# Patient Record
Sex: Male | Born: 1955 | Race: White | Hispanic: No | Marital: Married | Smoking: Never smoker
Health system: Southern US, Community
[De-identification: ages and names within clinical notes are randomized; demographics above are authoritative.]

---

## 2004-07-30 ENCOUNTER — Emergency Department (HOSPITAL_COMMUNITY): Admission: EM | Admit: 2004-07-30 | Discharge: 2004-07-30 | Payer: Self-pay | Admitting: Emergency Medicine

## 2005-09-29 IMAGING — CR DG CHEST 1V PORT
1 series · 1 of 1 positions shown · non-contrast
Comparison: none

CLINICAL DATA: Syncope. 
 PORTABLE CHEST ([DATE] HOURS)

[view not recorded]
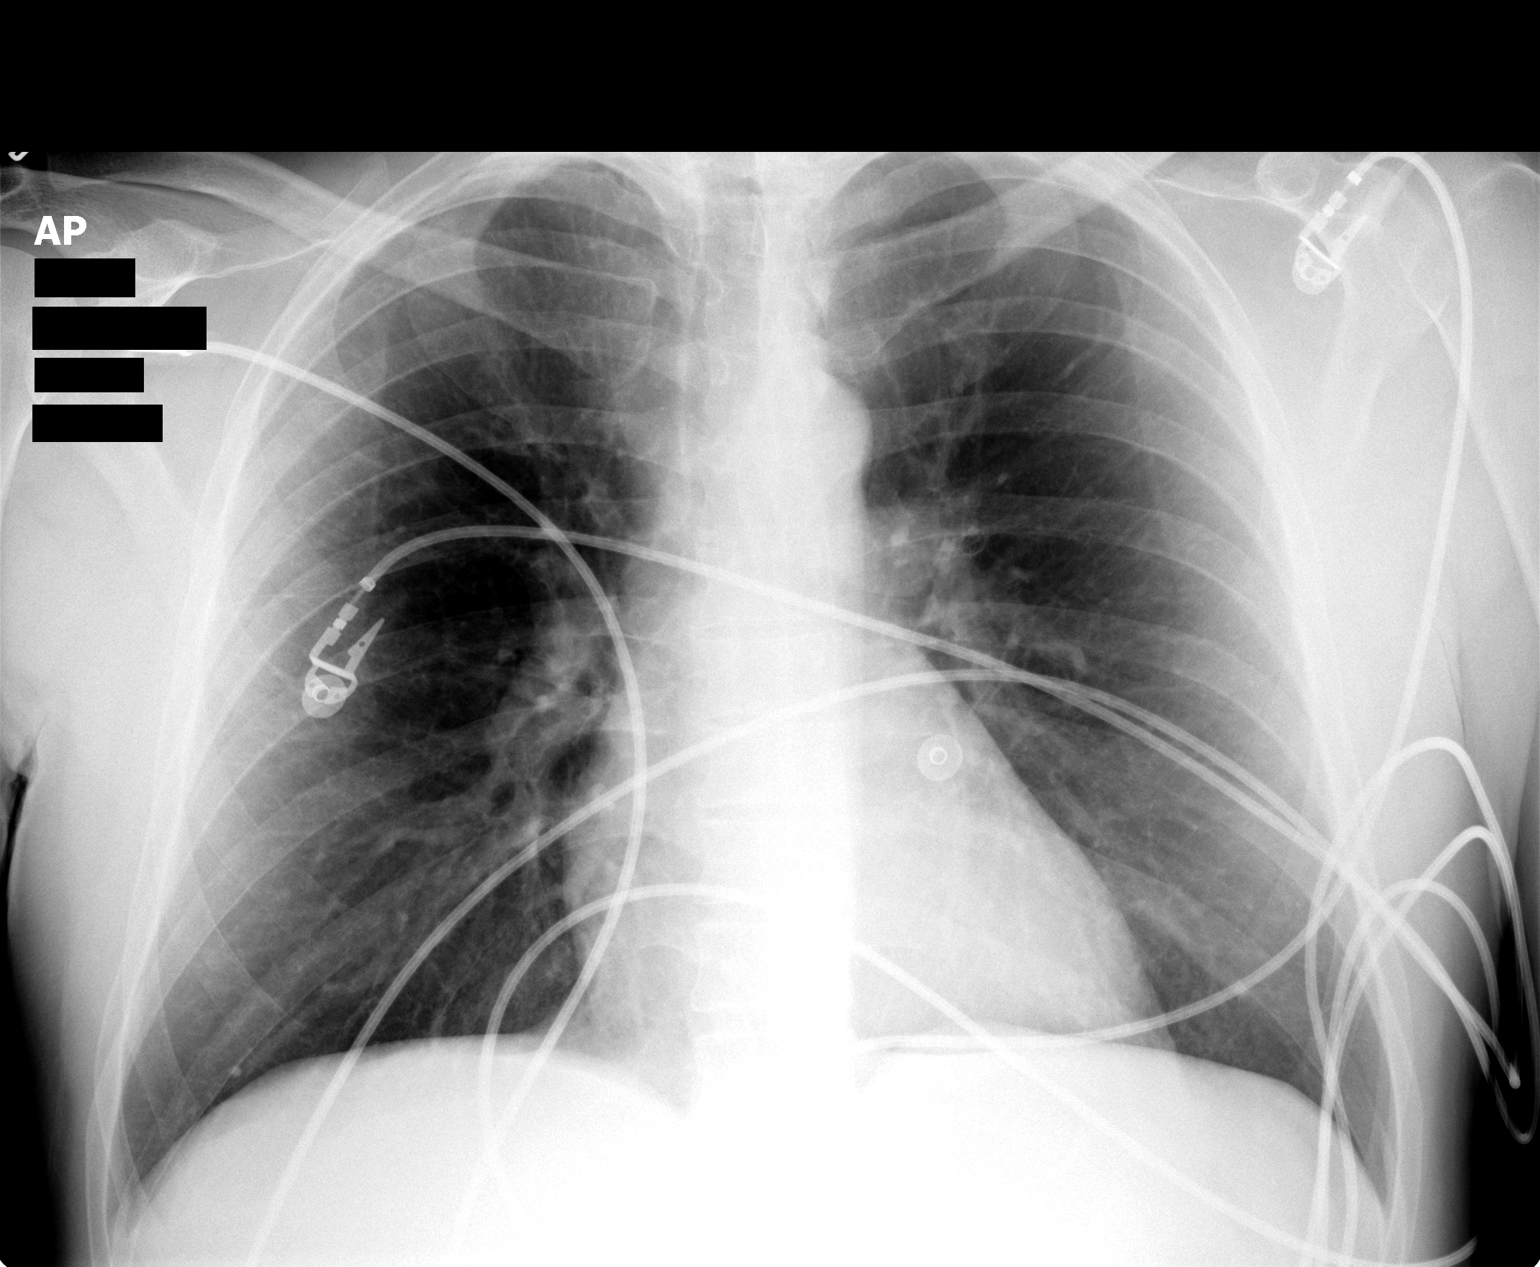

[1 of 1 positions shown; findings below may reference images not displayed]

FINDINGS: A calcified 2 to 3 mm nodule is seen at the right base.  There is a vague 5 mm nodule at the right base projecting over the right sixth rib.  The lungs are otherwise clear.  The heart is normal in size.  No pneumothoraces or effusions are seen. 
 IMPRESSION
 5 mm nodule at the right base.  Upright PA and lateral chest film is recommended.

## 2014-10-08 ENCOUNTER — Ambulatory Visit (INDEPENDENT_AMBULATORY_CARE_PROVIDER_SITE_OTHER): Payer: BC Managed Care – PPO | Admitting: Podiatrist

## 2014-10-08 ENCOUNTER — Encounter: Payer: Self-pay | Admitting: Podiatrist

## 2014-10-08 VITALS — BP 117/59 | HR 69 | Resp 16 | Ht 70.0 in | Wt 198.0 lb

## 2014-10-08 DIAGNOSIS — B351 Tinea unguium: Secondary | ICD-10-CM

## 2014-10-08 NOTE — Progress Notes (Signed)
   Subjective:    Patient ID: Ryan Williams, male    DOB: Jan 06, 1955, 59 y.o.   MRN: [id number]  HPI Comments: "I have this nail fungus"  Patient c/o thick, dark nails, especially the 1st nail right, for several years. He has tried several different OTC meds-no help.     Review of Systems  Skin:       Change in nails  All other systems reviewed and are negative.      Objective:   Physical Exam Patient is awake, alert, and oriented x 3.  In no acute distress.  Vascular status is intact with palpable pedal pulses at 2/4 DP and PT bilateral and capillary refill time within normal limits. Neurological sensation is also intact bilaterally via Semmes Weinstein monofilament at 5/5 sites. Light touch, vibratory sensation, Achilles tendon reflex is intact. Dermatological exam reveals skin color, turger and texture as normal. No open lesions present.  Musculature intact with dorsiflexion, plantarflexion, inversion, eversion.  Toenails and especially the Right hallux nail is thickened, discolored, dystrophic and clinically mycotic. Multiple sign of mycotic infection is present with subungual debris also noted.    Assessment & Plan:  Dermatophytosis of toenail  Plan: Discussed treatment options and alternatives. At the sample of the toenail to decide on treatment options. Will call with the results of the fungal culture. Likely would be a candidate for Lamisil if the culture comes back positive for dermatophyte. Also will perform liver function panel prior to starting on any medication.

## 2014-10-08 NOTE — Patient Instructions (Signed)

## 2014-11-05 ENCOUNTER — Telehealth: Payer: Self-pay | Admitting: *Deleted

## 2014-11-05 DIAGNOSIS — Z79899 Other long term (current) drug therapy: Secondary | ICD-10-CM

## 2014-11-05 MED ORDER — EFINACONAZOLE 10 % EX SOLN: 1.0000 "application " | Freq: Every day | CUTANEOUS | Status: DC

## 2014-11-05 NOTE — Telephone Encounter (Signed)
I met with Dr. Valentina Lucks on 10/09 regarding some toenail fungus.  I have not heard from her.  l know she was going to do a culture and a lab.  I was kind of under impression that I would hear something in a couple of weeks to know what the results were.  Also she sent me home with some literature on Jublia.  I thought they were going to contact me or somehow I was going to get a prescription for that and I haven't heard anything.  I don't know if I need to call them or what that process is.  Give me a call, thank you.  I returned his call and informed him we just got the results and it came back positive for fungus.  She wants you to start taking Lamisil.  "I've already taken Lamisil for 6 months and it hasn't helped.  We discussed this when I was in there last.  She said it may be a yeast and could probably give me a different treatment.  Can you ask her if there is something else or do I need to come in there for follow-up?"  I told him I would ask and call him back with a response.  I asked if he wanted the number to [facility] in regards to the [place].  He stated, "Yes, I will call them.  Will you call me back today?"  I told him I would call him once I got a response.  I noticed that the Jublia had not been prescribed so I sent in the prescription to [place].

## 2014-11-09 MED ORDER — ITRACONAZOLE 200 MG PO TABS: 1.0000 | ORAL_TABLET | Freq: Every day | ORAL | Status: DC

## 2014-11-09 MED ORDER — EFINACONAZOLE 10 % EX SOLN: 1.0000 "application " | Freq: Every day | CUTANEOUS | Status: DC

## 2014-11-09 NOTE — Telephone Encounter (Signed)
I left a message for the patient to call me back.  Dr. Irving ShowsEgerton is prescribing Onmell which [facility].  Sending a prescription for Jublia to the patient as well as a coupon.  Also sending a lab requisition for him to have a liver function test performed along with a map.

## 2014-11-09 NOTE — Telephone Encounter (Signed)
He is correct, I was going to put him on another medication-- call in Onmel 200 mg #28 with 2 refills to [facility].  Also, will need a liver function panel at 1 month.   [facility] doesn't do business with [facility] any longer-- they cut all ties last week.  Please cancel the rx.    We will have to send him a coupon for the Jublia medication and he can get it at his regular pharmacy.  Go ahead and print out the rx and send the coupon to him in the mail.

## 2014-11-10 NOTE — Telephone Encounter (Signed)
"  I'm returning your call."  I informed him that Dr. Irving ShowsEgerton wants to start you on Onmell we are [facility] it [facility].  We are mailing you a requisition for a liver function test with a map for the locations as well as a prescription for Jublia along with a coupon.  "Is it all right to have my primary care doctor do the blood work?  I am concerned about my liver, I had Hepatitis a few years ago."  I told him it is fine to have it done by your primary doctor.  "Is this the same as Lamisil?"  I told him it is different.  We will keep an eye on your liver function.  "Okay thank you for letting me know."

## 2014-11-10 NOTE — Telephone Encounter (Signed)
[facility] from [facility] asking me to call her back.  So I'm doing that if you would just give me a call to let me know what that's in regards to I would appreciate it.  Thank you

## 2014-11-19 ENCOUNTER — Encounter: Payer: Self-pay | Admitting: Podiatrist

## 2017-11-11 DIAGNOSIS — B078 Other viral warts: Secondary | ICD-10-CM | POA: Diagnosis not present

## 2018-06-09 DIAGNOSIS — Z125 Encounter for screening for malignant neoplasm of prostate: Secondary | ICD-10-CM | POA: Diagnosis not present

## 2018-06-09 DIAGNOSIS — Z0000 Encounter for general adult medical examination without abnormal findings: Secondary | ICD-10-CM | POA: Diagnosis not present

## 2018-06-09 DIAGNOSIS — E785 Hyperlipidemia, unspecified: Secondary | ICD-10-CM | POA: Diagnosis not present

## 2018-06-09 DIAGNOSIS — R6882 Decreased libido: Secondary | ICD-10-CM | POA: Diagnosis not present

## 2018-07-02 DIAGNOSIS — E291 Testicular hypofunction: Secondary | ICD-10-CM | POA: Diagnosis not present

## 2018-08-14 DIAGNOSIS — E291 Testicular hypofunction: Secondary | ICD-10-CM | POA: Diagnosis not present

## 2018-08-14 DIAGNOSIS — E785 Hyperlipidemia, unspecified: Secondary | ICD-10-CM | POA: Diagnosis not present

## 2018-09-12 DIAGNOSIS — E291 Testicular hypofunction: Secondary | ICD-10-CM | POA: Diagnosis not present

## 2018-09-30 DIAGNOSIS — Z23 Encounter for immunization: Secondary | ICD-10-CM | POA: Diagnosis not present

## 2018-09-30 DIAGNOSIS — E291 Testicular hypofunction: Secondary | ICD-10-CM | POA: Diagnosis not present

## 2018-09-30 DIAGNOSIS — N529 Male erectile dysfunction, unspecified: Secondary | ICD-10-CM | POA: Diagnosis not present

## 2018-10-14 DIAGNOSIS — E291 Testicular hypofunction: Secondary | ICD-10-CM | POA: Diagnosis not present

## 2018-10-30 DIAGNOSIS — K644 Residual hemorrhoidal skin tags: Secondary | ICD-10-CM | POA: Diagnosis not present

## 2019-05-20 DIAGNOSIS — M25531 Pain in right wrist: Secondary | ICD-10-CM | POA: Diagnosis not present

## 2019-05-20 DIAGNOSIS — Z125 Encounter for screening for malignant neoplasm of prostate: Secondary | ICD-10-CM | POA: Diagnosis not present

## 2019-05-20 DIAGNOSIS — Z1159 Encounter for screening for other viral diseases: Secondary | ICD-10-CM | POA: Diagnosis not present

## 2019-05-20 DIAGNOSIS — N529 Male erectile dysfunction, unspecified: Secondary | ICD-10-CM | POA: Diagnosis not present

## 2019-05-20 DIAGNOSIS — E785 Hyperlipidemia, unspecified: Secondary | ICD-10-CM | POA: Diagnosis not present

## 2019-05-20 DIAGNOSIS — Z0000 Encounter for general adult medical examination without abnormal findings: Secondary | ICD-10-CM | POA: Diagnosis not present

## 2019-11-04 DIAGNOSIS — Z23 Encounter for immunization: Secondary | ICD-10-CM | POA: Diagnosis not present

## 2023-10-23 ENCOUNTER — Encounter: Payer: Self-pay | Admitting: Dermatology

## 2023-10-23 ENCOUNTER — Ambulatory Visit: Payer: 59 | Admitting: Dermatology

## 2023-10-23 VITALS — BP 114/71 | HR 64

## 2023-10-23 DIAGNOSIS — D492 Neoplasm of unspecified behavior of bone, soft tissue, and skin: Secondary | ICD-10-CM

## 2023-10-23 DIAGNOSIS — L814 Other melanin hyperpigmentation: Secondary | ICD-10-CM | POA: Diagnosis not present

## 2023-10-23 DIAGNOSIS — D229 Melanocytic nevi, unspecified: Secondary | ICD-10-CM

## 2023-10-23 DIAGNOSIS — D485 Neoplasm of uncertain behavior of skin: Secondary | ICD-10-CM

## 2023-10-23 DIAGNOSIS — L738 Other specified follicular disorders: Secondary | ICD-10-CM

## 2023-10-23 DIAGNOSIS — L570 Actinic keratosis: Secondary | ICD-10-CM

## 2023-10-23 DIAGNOSIS — Z1283 Encounter for screening for malignant neoplasm of skin: Secondary | ICD-10-CM

## 2023-10-23 DIAGNOSIS — W908XXA Exposure to other nonionizing radiation, initial encounter: Secondary | ICD-10-CM | POA: Diagnosis not present

## 2023-10-23 DIAGNOSIS — L821 Other seborrheic keratosis: Secondary | ICD-10-CM

## 2023-10-23 DIAGNOSIS — B079 Viral wart, unspecified: Secondary | ICD-10-CM

## 2023-10-23 DIAGNOSIS — D2261 Melanocytic nevi of right upper limb, including shoulder: Secondary | ICD-10-CM

## 2023-10-23 DIAGNOSIS — D1801 Hemangioma of skin and subcutaneous tissue: Secondary | ICD-10-CM

## 2023-10-23 DIAGNOSIS — L578 Other skin changes due to chronic exposure to nonionizing radiation: Secondary | ICD-10-CM

## 2023-10-23 HISTORY — DX: Melanocytic nevi, unspecified: D22.9

## 2023-10-23 NOTE — Patient Instructions (Addendum)
Hello Ryan Williams,  Thank you for visiting [place] today. We appreciate your commitment to improving your skin health. Here is a summary of the key instructions from today's consultation:  - Biopsy: An abnormal dark spot on your back will undergo a shave biopsy. The area will be numbed, and the sample sent to the lab. Results are expected in about a week.  - Treatment for Precancerous Spots: We treated several spots with liquid nitrogen. These spots will crust and fall off in about 2 weeks.  - Skin Tags: Cosmetic removal is available for up to 15 skin tags at a cost of $150. A $100 deposit is required, with the remaining $50 due at the appointment.  - Seborrheic Keratosis and Sebaceous Hyperplasia: These can be treated with liquid nitrogen, which is covered by insurance.  - Post-Treatment Care: Apply Aquaphor to all treated spots, including the biopsy site. Cover the biopsy site with a Band-Aid daily for 1-2 weeks, depending on healing.  - Sunscreen Use: It is advised to regularly use sunscreen, especially given the moderate actinic damage observed.  - Follow-Up: Return in one year for your next skin check. If you notice any new moles or non-healing pimples, especially if they bleed, contact us sooner.  Please feel free to reach out if you have any questions or concerns about your treatment plan.  Best regards,  Dr. Langston Reusing [facility]      Patient Handout: Wound Care for Skin Biopsy Site  Taking Care of Your Skin Biopsy Site  Proper care of the biopsy site is essential for promoting healing and minimizing scarring. This handout provides instructions on how to care for your biopsy site to ensure optimal recovery.  1. Cleaning the Wound:  Clean the biopsy site daily with gentle soap and water. Gently pat the area dry with a clean, soft towel. Avoid harsh scrubbing or rubbing the area, as this can irritate the skin and delay healing.  2. Applying Aquaphor and Bandage:  After  cleaning the wound, apply a thin layer of Aquaphor ointment to the biopsy site. Cover the area with a sterile bandage to protect it from dirt, bacteria, and friction. Change the bandage daily or as needed if it becomes soiled or wet.  3. Continued Care for One Week:  Repeat the cleaning, Aquaphor application, and bandaging process daily for one week following the biopsy procedure. Keeping the wound clean and moist during this initial healing period will help prevent infection and promote optimal healing.  4. Massaging Aquaphor into the Area:  ---After one week, discontinue the use of bandages but continue to apply Aquaphor to the biopsy site. ----Gently massage the Aquaphor into the area using circular motions. ---Massaging the skin helps to promote circulation and prevent the formation of scar tissue.   Additional Tips:  Avoid exposing the biopsy site to direct sunlight during the healing process, as this can cause hyperpigmentation or worsen scarring. If you experience any signs of infection, such as increased redness, swelling, warmth, or drainage from the wound, contact your healthcare provider immediately. Follow any additional instructions provided by your healthcare provider for caring for the biopsy site and managing any discomfort. Conclusion:  Taking proper care of your skin biopsy site is crucial for ensuring optimal healing and minimizing scarring. By following these instructions for cleaning, applying Aquaphor, and massaging the area, you can promote a smooth and successful recovery. If you have any questions or concerns about caring for your biopsy site, don't hesitate to contact your healthcare  provider for guidance.    Cryotherapy Aftercare  Wash gently with soap and water everyday.   Apply Vaseline and Band-Aid daily until healed.    Important Information   Due to recent changes in healthcare laws, you may see results of your pathology and/or laboratory studies on  MyChart before the doctors have had a chance to review them. We understand that in some cases there may be results that are confusing or concerning to you. Please understand that not all results are received at the same time and often the doctors may need to interpret multiple results in order to provide you with the best plan of care or course of treatment. Therefore, we ask that you please give [place] 2 business days to thoroughly review all your results before contacting the office for clarification. Should we see a critical lab result, you will be contacted sooner.     If You Need Anything After Your Visit   If you have any questions or concerns for your doctor, please call our main line at [phone]. If no one answers, please leave a voicemail as directed and we will return your call as soon as possible. Messages left after 4 pm will be answered the following business day.    You may also send [place] a message via MyChart. We typically respond to MyChart messages within 1-2 business days.  For prescription refills, please ask your pharmacy to contact our office. Our fax number is [phone].  If you have an urgent issue when the clinic is closed that cannot wait until the next business day, you can page your doctor at the number below.     Please note that while we do our best to be available for urgent issues outside of office hours, we are not available 24/7.    If you have an urgent issue and are unable to reach [place], you may choose to seek medical care at your doctor's office, retail clinic, urgent care center, or emergency room.   If you have a medical emergency, please immediately call 911 or go to the emergency department. In the event of inclement weather, please call our main line at [phone] for an update on the status of any delays or closures.  Dermatology Medication Tips: Please keep the boxes that topical medications come in in order to help keep track of the instructions about  where and how to use these. Pharmacies typically print the medication instructions only on the boxes and not directly on the medication tubes.   If your medication is too expensive, please contact our office at [phone] or send [place] a message through MyChart.    We are unable to tell what your co-pay for medications will be in advance as this is different depending on your insurance coverage. However, we may be able to find a substitute medication at lower cost or fill out paperwork to get insurance to cover a needed medication.    If a prior authorization is required to get your medication covered by your insurance company, please allow [place] 1-2 business days to complete this process.   Drug prices often vary depending on where the prescription is filled and some pharmacies may offer cheaper prices.   The website www.goodrx.com contains coupons for medications through different pharmacies. The prices here do not account for what the cost may be with help from insurance (it may be cheaper with your insurance), but the website can give you the price if you did not use any  insurance.  - You can print the associated coupon and take it with your prescription to the pharmacy.  - You may also stop by our office during regular business hours and pick up a GoodRx coupon card.  - If you need your prescription sent electronically to a different pharmacy, notify our office through [facility] or by phone at [phone]

## 2023-10-23 NOTE — Progress Notes (Signed)
New Patient Visit   Subjective  Ryan Williams is a 68 y.o. male who presents for the following:  Total Body Skin Exam (TBSE)  Patient present today for new patient visit for TBSE.The patient reports he  has spots, moles and lesions to be evaluated, some may be new or changing and the patient may have concern these could be cancer. Patient has previously been treated by a dermatologist. Patient reports he  does not have hx of bx. Patient denies family history of skin cancers. Patient reports throughout his lifetime has had  severe  sun exposure. Currently, patient reports if he  has excessive sun exposure, he  does not apply sunscreen and/or wears protective coverings.  The following portions of the chart were reviewed this encounter and updated as appropriate: medications, allergies, medical history  Review of Systems:  No other skin or systemic complaints except as noted in HPI or Assessment and Plan.  Objective  Well appearing patient in no apparent distress; mood and affect are within normal limits.  A full examination was performed including scalp, head, eyes, ears, nose, lips, neck, chest, axillae, abdomen, back, buttocks, bilateral upper extremities, bilateral lower extremities, hands, feet, fingers, toes, fingernails, and toenails. All findings within normal limits unless otherwise noted below.   Relevant exam findings are noted in the Assessment and Plan.  Chest - Medial [facility]), Right Hand - Posterior (3), Right Lower Leg - Anterior Erythematous thin papules/macules with gritty scale  Right Posterior Shoulder 5 mm White macule with Black Speckle       Assessment & Plan   LENTIGINES, SEBORRHEIC KERATOSES, HEMANGIOMAS - Benign normal skin lesions - Benign-appearing - Call for any changes  MELANOCYTIC NEVI - Tan-brown and/or pink-flesh-colored symmetric macules and papules - Benign appearing on exam today - Observation - Call clinic for new or changing moles - Recommend  daily use of broad spectrum spf 30+ sunscreen to sun-exposed areas.   Moderate ACTINIC DAMAGE - Chronic condition, secondary to cumulative UV/sun exposure - diffuse scaly erythematous macules with underlying dyspigmentation - Recommend daily broad spectrum sunscreen SPF 30+ to sun-exposed areas, reapply every 2 hours as needed.  - Staying in the shade or wearing long sleeves, sun glasses (UVA+UVB protection) and wide brim hats (4-inch brim around the entire circumference of the hat) are also recommended for sun protection.  - Call for new or changing lesions.  Sebaceous Hyperplasia - Small yellow papules with a central dell - Benign-appearing - Observe. Call for changes.  SKIN CANCER SCREENING PERFORMED TODAY AK (actinic keratosis) (5) Right Hand - Posterior (3); Right Lower Leg - Anterior; Chest - Medial [facility])  Destruction of lesion - Chest - Medial (Center), Right Hand - Posterior (3), Right Lower Leg - Anterior Complexity: simple   Destruction method: cryotherapy   Informed consent: discussed and consent obtained   Timeout:  patient name, date of birth, surgical site, and procedure verified Lesion destroyed using liquid nitrogen: Yes   Cryotherapy cycles:  15 Post-procedure details: wound care instructions given    Neoplasm of uncertain behavior of skin Right Posterior Shoulder  Skin / nail biopsy Type of biopsy: tangential   Informed consent: discussed and consent obtained   Timeout: patient name, date of birth, surgical site, and procedure verified   Procedure prep:  Patient was prepped and draped in usual sterile fashion Prep type:  Isopropyl alcohol Anesthesia: the lesion was anesthetized in a standard fashion   Anesthetic:  1% lidocaine w/ epinephrine 1-100,000 buffered w/ 8.4% NaHCO3  Instrument used: DermaBlade   Hemostasis achieved with: aluminum chloride   Outcome: patient tolerated procedure well   Post-procedure details: sterile dressing applied and wound care  instructions given   Dressing type: petrolatum gauze and bandage    Specimen 1 - Surgical pathology Differential Diagnosis: DN  Check Margins: No  Ver[place]ca (3) Left Nasal Sidewall; Right Nasal Sidewall; Dorsum of Nose  Destruction of lesion - Dorsum of Nose, Left Nasal Sidewall, Right Nasal Sidewall Complexity: simple   Destruction method: cryotherapy   Informed consent: discussed and consent obtained   Timeout:  patient name, date of birth, surgical site, and procedure verified Lesion destroyed using liquid nitrogen: Yes   Cryotherapy cycles:  3 Post-procedure details: wound care instructions given    Skin exam for malignant neoplasm  Multiple benign melanocytic nevi  Lentigines  Actinic skin damage  Cherry angioma    Return in about 1 year (around 10/22/2024) for TBSE.   Documentation: I have reviewed the above documentation for accuracy and completeness, and I agree with the above.  Stasia Cavalier, am acting as scribe for Langston Reusing, DO.  Langston Reusing, DO

## 2023-10-24 LAB — SURGICAL PATHOLOGY

## 2023-10-28 NOTE — Progress Notes (Signed)
Hi Ryan Williams,  Please refer to Dr. Caralyn Guile for a surgical excision.  Ryan Williams, I'm linking you and [name] so you can review how to process biopsy results in the path log book and review scheduling for Dr. Caralyn Guile.   Thanks :)   Diagnosis Skin , right posterior shoulder DYSPLASTIC JUNCTIONAL LENTIG

## 2023-10-29 ENCOUNTER — Telehealth: Payer: Self-pay

## 2023-10-29 NOTE — Telephone Encounter (Signed)
All numbers for patient in chart do not work. Left message on wife's VM for patient to call office/HD

## 2023-10-29 NOTE — Telephone Encounter (Signed)
-----   Message from Langston Reusing sent at 10/28/2023  1:47 PM EDT ----- Enrique Sack,  Please refer to Dr. Caralyn Guile for a surgical excision.  Amarii Amy, I'm linking you and [name] so you can review how to process biopsy results in the path log book and review scheduling for Dr. Caralyn Guile.   Thanks :)   Diagnosis Skin , right posterior shoulder DYSPLASTIC JUNCTIONAL LENTIG

## 2023-11-05 ENCOUNTER — Telehealth: Payer: Self-pay

## 2023-11-05 NOTE — Telephone Encounter (Signed)
Advised patient of results and will schedule for excision with Dr Paci/hd

## 2023-11-05 NOTE — Telephone Encounter (Signed)
-----   Message from Langston Reusing sent at 10/28/2023  1:47 PM EDT ----- Enrique Sack,  Please refer to Dr. Caralyn Guile for a surgical excision.  Amarii Amy, I'm linking you and [name] so you can review how to process biopsy results in the path log book and review scheduling for Dr. Caralyn Guile.   Thanks :)   Diagnosis Skin , right posterior shoulder DYSPLASTIC JUNCTIONAL LENTIG

## 2023-11-05 NOTE — Telephone Encounter (Signed)
Left message for patient to call office regarding pathology results/hd

## 2023-12-16 ENCOUNTER — Encounter: Payer: Self-pay | Admitting: Dermatology

## 2023-12-17 ENCOUNTER — Ambulatory Visit: Payer: 59 | Admitting: Dermatology

## 2023-12-17 ENCOUNTER — Encounter: Payer: Self-pay | Admitting: Dermatology

## 2023-12-17 VITALS — BP 110/66 | HR 66

## 2023-12-17 DIAGNOSIS — D239 Other benign neoplasm of skin, unspecified: Secondary | ICD-10-CM

## 2023-12-17 DIAGNOSIS — D2361 Other benign neoplasm of skin of right upper limb, including shoulder: Secondary | ICD-10-CM

## 2023-12-17 NOTE — Patient Instructions (Signed)

## 2023-12-17 NOTE — Progress Notes (Signed)
Follow-Up Visit   Subjective  Ryan Williams is a 68 y.o. 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 Onalee Hua.  The following portions of the chart were reviewed this encounter and updated as appropriate: medications, allergies, medical history  Review of Systems:  No other skin or systemic complaints except as noted in HPI or Assessment and Plan.  Objective  Well appearing patient in no apparent distress; mood and affect are within normal limits.  A focused examination was performed of the following areas: Right posterior shoulder Relevant physical exam findings are noted in the Assessment and Plan.     Assessment & Plan   DYSPLASTIC NEVUS Right  posterior Shoulder Skin excision - Right  posterior Shoulder  Excision method:  elliptical Lesion length (cm):  0.8 Lesion width (cm):  0.8 Margin per side (cm):  0.5 Total excision diameter (cm):  1.8 Informed consent: discussed and consent obtained   Timeout: patient name, date of birth, surgical site, and procedure verified   Procedure prep:  Patient was prepped and draped in usual sterile fashion Prep type:  Chlorhexidine Anesthesia: the lesion was anesthetized in a standard fashion   Anesthetic:  1% lidocaine w/ epinephrine 1-100,000 buffered w/ 8.4% NaHCO3 Instrument used: #15 blade   Hemostasis achieved with: suture, pressure and electrodesiccation   Hemostasis achieved with comment:  3.0 PDS with dermabond and steri strip Outcome: patient tolerated procedure well with no complications   Post-procedure details: sterile dressing applied and wound care instructions given   Dressing type: pressure dressing and bacitracin   Additional details:  Final length 4.9  Skin repair - Right  posterior Shoulder Complexity:  Complex Final length (cm):  4.9 Timeout: patient name, date of birth, surgical site, and procedure verified   Procedure prep:  Patient was  prepped and draped in usual sterile fashion Prep type:  Chlorhexidine Anesthesia: the lesion was anesthetized in a standard fashion   Anesthetic:  1% lidocaine w/ epinephrine 1-100,000 buffered w/ 8.4% NaHCO3 Reason for type of repair: reduce tension to allow closure, allow closure of the large defect, preserve normal anatomy and preserve normal anatomical and functional relationships   Undermining: area extensively undermined   Subcutaneous layers (deep stitches):  Suture size:  3-0 Suture type: PDS (polydioxanone)   Stitches:  Buried vertical mattress Fine/surface layer approximation (top stitches):  Suture type: cyanoacrylate tissue glue   Hemostasis achieved with: suture, pressure and electrodesiccation Outcome: patient tolerated procedure well with no complications   Post-procedure details: sterile dressing applied and wound care instructions given   Dressing type: bacitracin and pressure dressing   Specimen 1 - Surgical pathology Differential Diagnosis: DN [phone] Check Margins: No  The surgical wound was then cleaned, prepped, and re-anesthetized as above. Wound edges were undermined extensively along at least one entire edge and at a distance equal to or greater than the width of the defect (see wound defect size above) in order to achieve closure and decrease wound tension and anatomic distortion. Redundant tissue repair including standing cone removal was performed. Hemostasis was achieved with electrocautery. Subcutaneous and epidermal tissues were approximated with the above sutures. The surgical site was then lightly scrubbed with sterile, saline-soaked gauze. Steri-strips were applied, and the area was then bandaged using Vaseline ointment, non-adherent gauze, gauze pads, and tape to provide an adequate pressure dressing. The patient tolerated the procedure well, was given detailed written and verbal wound care instructions, and was discharged in  good condition.   The  patient will follow-up: PRN.  No follow-ups on file.  I, Tillie Fantasia, CMA, am acting as scribe for Gwenith Daily, MD.   Documentation: I have reviewed the above documentation for accuracy and completeness, and I agree with the above.  Gwenith Daily, MD

## 2023-12-20 LAB — SURGICAL PATHOLOGY

## 2023-12-27 ENCOUNTER — Telehealth: Payer: Self-pay | Admitting: Dermatology

## 2023-12-27 NOTE — Telephone Encounter (Signed)
Spoke with pt and reassured spot was benign and no further treatment is needed

## 2023-12-27 NOTE — Telephone Encounter (Signed)
-----   Message from [facility] PACI sent at 12/26/2023 10:21 AM EST ----- Communicated clear margins for excision of DN on right shoulder. Reassured. Please recommend 6-12 month skin checks  Communication: MyChart

## 2024-04-03 ENCOUNTER — Other Ambulatory Visit: Payer: Self-pay | Admitting: Medical Genetics

## 2024-04-06 ENCOUNTER — Other Ambulatory Visit (HOSPITAL_COMMUNITY)

## 2024-04-09 ENCOUNTER — Other Ambulatory Visit (HOSPITAL_COMMUNITY)
Admission: RE | Admit: 2024-04-09 | Discharge: 2024-04-09 | Disposition: A | Discharge status: Home / Self Care | Payer: Self-pay | Source: Ambulatory Visit | Attending: Medical Genetics | Admitting: Medical Genetics

## 2024-04-21 LAB — GENECONNECT MOLECULAR SCREEN: Genetic Analysis Overall Interpretation: NEGATIVE

## 2024-10-22 ENCOUNTER — Encounter: Payer: Self-pay | Admitting: Dermatology

## 2024-10-22 ENCOUNTER — Ambulatory Visit: Payer: 59 | Admitting: Dermatology

## 2024-10-22 VITALS — BP 148/97 | HR 64

## 2024-10-22 DIAGNOSIS — D489 Neoplasm of uncertain behavior, unspecified: Secondary | ICD-10-CM

## 2024-10-22 DIAGNOSIS — D1801 Hemangioma of skin and subcutaneous tissue: Secondary | ICD-10-CM

## 2024-10-22 DIAGNOSIS — D235 Other benign neoplasm of skin of trunk: Secondary | ICD-10-CM

## 2024-10-22 DIAGNOSIS — Z1283 Encounter for screening for malignant neoplasm of skin: Secondary | ICD-10-CM | POA: Diagnosis not present

## 2024-10-22 DIAGNOSIS — L814 Other melanin hyperpigmentation: Secondary | ICD-10-CM | POA: Diagnosis not present

## 2024-10-22 DIAGNOSIS — D229 Melanocytic nevi, unspecified: Secondary | ICD-10-CM

## 2024-10-22 DIAGNOSIS — L578 Other skin changes due to chronic exposure to nonionizing radiation: Secondary | ICD-10-CM

## 2024-10-22 NOTE — Progress Notes (Signed)
   Total Body Skin Exam (TBSE) Visit   Subjective  Ryan Williams is a 69 y.o. male who presents for the following: Skin Cancer Screening and Full Body Skin Exam  Patient presents today for follow up visit for TBSE. Patient was last evaluated on 10/23/23.  Patient denies medication changes. Patient reports he does have spots, moles and lesions of concern to be evaluated (on thumb, eyelid and inner left thigh)  Patient reports throughout his lifetime he  has had moderate sun exposure. Currently, patient reports if he has moderate sun exposure, he does not apply sunscreen and/or wears protective coverings.  Patient reports he has hx of bx.  Patient denies  family history of skin cancers. The patient has spots, moles and lesions to be evaluated, some may be new or changing and the patient has concerns that these could be cancer.   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  Objective  Well appearing patient in no apparent distress; mood and affect are within normal limits.  A full examination was performed including scalp, head, eyes, ears, nose, lips, neck, chest, axillae, abdomen, back, buttocks, bilateral upper extremities, bilateral lower extremities, hands, feet, fingers, toes, fingernails, and toenails. All findings within normal limits unless otherwise noted below.   Relevant physical exam findings are noted in the Assessment and Plan.  Right Upper Back 5mm Irregular Dark Brown Macule  Assessment & Plan   LENTIGINES, SEBORRHEIC KERATOSES, HEMANGIOMAS - Benign normal skin lesions - Benign-appearing - Call for any changes - Recommended seeing  an Ophthalmologist for SK near eye  MELANOCYTIC NEVI - Tan-brown and/or pink-flesh-colored symmetric macules and papules - Benign appearing on exam today - Observation - Call clinic for new or changing moles - Recommend daily use of broad spectrum spf 30+ sunscreen to sun-exposed areas.   ACTINIC DAMAGE - Chronic condition, secondary to cumulative UV/sun exposure - diffuse scaly erythematous macules with underlying dyspigmentation - Recommend daily broad spectrum sunscreen SPF 30+ to sun-exposed areas, reapply every 2 hours as needed.  - Staying in the shade or wearing long sleeves, sun glasses (UVA+UVB protection) and wide brim hats (4-inch brim around the entire circumference of the hat) are also recommended for sun protection.  - Call for new or changing lesions.      SKIN CANCER SCREENING PERFORMED TODAY.    NEOPLASM OF UNCERTAIN BEHAVIOR Right Upper Back Epidermal / dermal shaving  Lesion diameter (cm):  0.5 Informed consent: discussed and consent obtained   Timeout: patient name, date of birth, surgical site, and procedure verified   Instrument used: DermaBlade   Hemostasis achieved with: aluminum chloride   Outcome: patient tolerated procedure well   Post-procedure details: wound care instructions given    Return in about 1 year (around 10/22/2025) for FBSE F/U.  I, Lyle Cords, as acting as a Neurosurgeon for [employer], DO .   Documentation: I have reviewed the above documentation for accuracy and completeness, and I agree with the above.  Delon Lenis, DO

## 2024-10-22 NOTE — Patient Instructions (Addendum)

## 2024-10-26 LAB — SURGICAL PATHOLOGY

## 2024-10-27 ENCOUNTER — Ambulatory Visit: Payer: Self-pay | Admitting: Dermatology

## 2025-10-25 ENCOUNTER — Ambulatory Visit: Admitting: Dermatology
